# Patient Record
Sex: Female | Born: 1995 | Race: Black or African American | Hispanic: No | Marital: Single | Smoking: Never smoker
Health system: Southern US, Community
[De-identification: ages and names within clinical notes are randomized; demographics above are authoritative.]

---

## 2019-01-12 ENCOUNTER — Encounter: Payer: Self-pay | Admitting: *Deleted

## 2019-01-12 ENCOUNTER — Emergency Department
Admission: EM | Admit: 2019-01-12 | Discharge: 2019-01-12 | Disposition: A | Discharge status: Home / Self Care | Payer: Self-pay | Attending: Emergency Medicine | Admitting: Emergency Medicine

## 2019-01-12 ENCOUNTER — Emergency Department: Payer: Self-pay

## 2019-01-12 ENCOUNTER — Other Ambulatory Visit: Payer: Self-pay

## 2019-01-12 DIAGNOSIS — Y998 Other external cause status: Secondary | ICD-10-CM | POA: Insufficient documentation

## 2019-01-12 DIAGNOSIS — W450XXA Nail entering through skin, initial encounter: Secondary | ICD-10-CM | POA: Insufficient documentation

## 2019-01-12 DIAGNOSIS — S91132A Puncture wound without foreign body of left great toe without damage to nail, initial encounter: Secondary | ICD-10-CM | POA: Insufficient documentation

## 2019-01-12 DIAGNOSIS — Y9301 Activity, walking, marching and hiking: Secondary | ICD-10-CM | POA: Insufficient documentation

## 2019-01-12 DIAGNOSIS — Y929 Unspecified place or not applicable: Secondary | ICD-10-CM | POA: Insufficient documentation

## 2019-01-12 DIAGNOSIS — T148XXA Other injury of unspecified body region, initial encounter: Secondary | ICD-10-CM

## 2019-01-12 DIAGNOSIS — Z23 Encounter for immunization: Secondary | ICD-10-CM | POA: Insufficient documentation

## 2019-01-12 MED ORDER — AMOXICILLIN-POT CLAVULANATE 875-125 MG PO TABS: 1.0000 | ORAL_TABLET | Freq: Two times a day (BID) | ORAL | 0 refills | Status: AC

## 2019-01-12 MED ORDER — TETANUS-DIPHTH-ACELL PERTUSSIS 5-2.5-18.5 LF-MCG/0.5 IM SUSP
0.5000 mL | Freq: Once | INTRAMUSCULAR | Status: AC
  Administered 2019-01-12: 0.5 mL via INTRAMUSCULAR
  Filled 2019-01-12: qty 0.5

## 2019-01-12 NOTE — Discharge Instructions (Signed)
Take Augmentin twice daily for the next 10 days. °

## 2019-01-12 NOTE — ED Triage Notes (Signed)
Pt reports she stepped on a nail on her left foot yesterday and is now having pain. Worse when walking.

## 2019-01-12 NOTE — ED Notes (Signed)
See triage note  States she stepped on a nail yesterday  Having pain to right great toe area  Small puncture area noted

## 2019-01-12 NOTE — ED Notes (Signed)
Discharge instructions reviewed with ASL interpreter. Pt calling aunt for ride home at this time.

## 2019-01-12 NOTE — ED Provider Notes (Signed)
[facility] Emergency Department Provider Note  ____________________________________________  Time seen: Approximately 7:05 PM  I have reviewed the triage vital signs and the nursing notes.   HISTORY  Chief Complaint Toe Pain    HPI Casey Farrell is a [age] y.o. female presents to the emergency department with right great toe pain after patient stepped on a nail yesterday while barefoot.  Patient states that she has experienced pain and discomfort since.  She does not recall her last tetanus shot.  No alleviating measures have been attempted.        History reviewed. No pertinent past medical history.  There are no active problems to display for this patient.   History reviewed. No pertinent surgical history.  Prior to Admission medications   Medication Sig Start Date End Date Taking? Authorizing Provider  amoxicillin-clavulanate (AUGMENTIN) 875-125 MG tablet Take 1 tablet by mouth 2 (two) times daily for 10 days. 01/12/19 01/22/19  Orvil FeilWoods, Jaclyn M, PA-C    Allergies Patient has no allergy information on record.  History reviewed. No pertinent family history.  Social History Social History   Tobacco Use  . Smoking status: Never Smoker  . Smokeless tobacco: Never Used  Substance Use Topics  . Alcohol use: Never    Frequency: Never  . Drug use: Never     Review of Systems  Constitutional: No fever/chills Eyes: No visual changes. No discharge ENT: No upper respiratory complaints. Cardiovascular: no chest pain. Respiratory: no cough. No SOB. Gastrointestinal: No abdominal pain.  No nausea, no vomiting.  No diarrhea.  No constipation. Musculoskeletal: Patient has right great toe pain.  Skin: Negative for rash, abrasions, lacerations, ecchymosis. Neurological: Negative for headaches, focal weakness or numbness.   ____________________________________________   PHYSICAL EXAM:  VITAL SIGNS: ED Triage Vitals  Enc Vitals Group     BP  01/12/19 1800 128/72     Pulse Rate 01/12/19 1800 70     Resp 01/12/19 1800 18     Temp 01/12/19 1800 98 F (36.7 C)     Temp Source 01/12/19 1800 Oral     SpO2 01/12/19 1800 99 %     Weight --      Height 01/12/19 1737 5\' 2"  (1.575 m)     Head Circumference --      Peak Flow --      Pain Score 01/12/19 1736 6     Pain Loc --      Pain Edu? --      Excl. in GC? --      Constitutional: Alert and oriented. Well appearing and in no acute distress. Eyes: Conjunctivae are normal. PERRL. EOMI. Head: Atraumatic. Cardiovascular: Normal rate, regular rhythm. Normal S1 and S2.  Good peripheral circulation. Respiratory: Normal respiratory effort without tachypnea or retractions. Lungs CTAB. Good air entry to the bases with no decreased or absent breath sounds. Musculoskeletal: Full range of motion to all extremities. No gross deformities appreciated. Neurologic:  Normal speech and language. No gross focal neurologic deficits are appreciated.  Skin: Patient has puncture wound along the plantar aspect of her right foot with scab formation. Psychiatric: Mood and affect are normal. Speech and behavior are normal. Patient exhibits appropriate insight and judgement.   ____________________________________________   LABS (all labs ordered are listed, but only abnormal results are displayed)  Labs Reviewed - No data to display ____________________________________________  EKG   ____________________________________________  RADIOLOGY I personally viewed and evaluated these images as part of my medical decision making,  as well as reviewing the written report by the radiologist.  Dg Foot Complete Right  Result Date: 01/12/2019 CLINICAL DATA:  Pain after stepping on nail EXAM: RIGHT FOOT COMPLETE - 3+ VIEW COMPARISON:  None. FINDINGS: Frontal, oblique, and lateral views obtained. No fracture or dislocation. Joint spaces appear normal. No erosive change. No radiopaque foreign body or soft tissue  air. IMPRESSION: No fracture or dislocation. No evident arthropathy. No radiopaque foreign body. Electronically Signed   By: Bretta Bang III M.D.   On: 01/12/2019 18:35    ____________________________________________    PROCEDURES  Procedure(s) performed:    Procedures    Medications  Tdap (BOOSTRIX) injection 0.5 mL (has no administration in time range)     ____________________________________________   INITIAL IMPRESSION / ASSESSMENT AND PLAN / ED COURSE  Pertinent labs & imaging results that were available during my care of the patient were reviewed by me and considered in my medical decision making (see chart for details).  Review of the [place] CSRS was performed in accordance of the [facility] prior to dispensing any controlled drugs.           Assessment and plan Puncture wound 23 year old female presents to the emergency department with right great toe pain after stepping on a metal nail.  X-ray examination of the right great toe reveals no bony abnormality or retained foreign body.  Patient's tetanus status was updated in the emergency department and she was discharged with Augmentin.  Use of an American sign language translator was used during this emergency department encounter.  All patient questions were answered.    ____________________________________________  FINAL CLINICAL IMPRESSION(S) / ED DIAGNOSES  Final diagnoses:  Puncture wound      NEW MEDICATIONS STARTED DURING THIS VISIT:  ED Discharge Orders         Ordered    amoxicillin-clavulanate (AUGMENTIN) 875-125 MG tablet  2 times daily     01/12/19 1903              This chart was dictated using voice recognition software/Dragon. Despite best efforts to proofread, errors can occur which can change the meaning. Any change was purely unintentional.    Orvil Feil, PA-C 01/12/19 Elige Ko, MD 01/12/19 2045

## 2019-01-12 NOTE — ED Notes (Signed)
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 Pt uses ASL and is able to read lips.

## 2019-01-25 LAB — HM PAP SMEAR

## 2019-02-10 ENCOUNTER — Telehealth: Payer: Self-pay

## 2019-02-13 NOTE — Telephone Encounter (Signed)
TC with mother and patient.  Pt is hearing impaired. Informed of ASCUS pap and need for repeat pap in 1 year. Aileen Fass, RN

## 2020-02-28 ENCOUNTER — Telehealth: Payer: Self-pay

## 2020-02-28 NOTE — Telephone Encounter (Signed)
Telephone call to patient today regarding the need for a repeat PAP and physical due in June.  Call dropped when Step Mom's cell was called.  Telephone call to other contact (Aunt Jenene Slicker).  She will have patient return my call today.  Casey Farrell was available to assist in scheduling an appointment in Epic. Hart Carwin, RN

## 2020-03-06 NOTE — Telephone Encounter (Signed)
Telephone call to patient today regarding the need for a repeat PAP.  Call dropped when Sep-Mother's number dialed.  Telephone call to [facility], she is upset we keep calling her even though she is listed in Epic as an emergency contact for patient.  She will give patient a message to return my call.  PAP letter mailed today. Hart Carwin, RN

## 2021-04-17 IMAGING — DX RIGHT FOOT COMPLETE - 3+ VIEW
3 series · 3 of 3 positions shown · non-contrast
Comparison: None.

CLINICAL DATA: Pain after stepping on nail

EXAM:
RIGHT FOOT COMPLETE - 3+ VIEW

[foot ap]
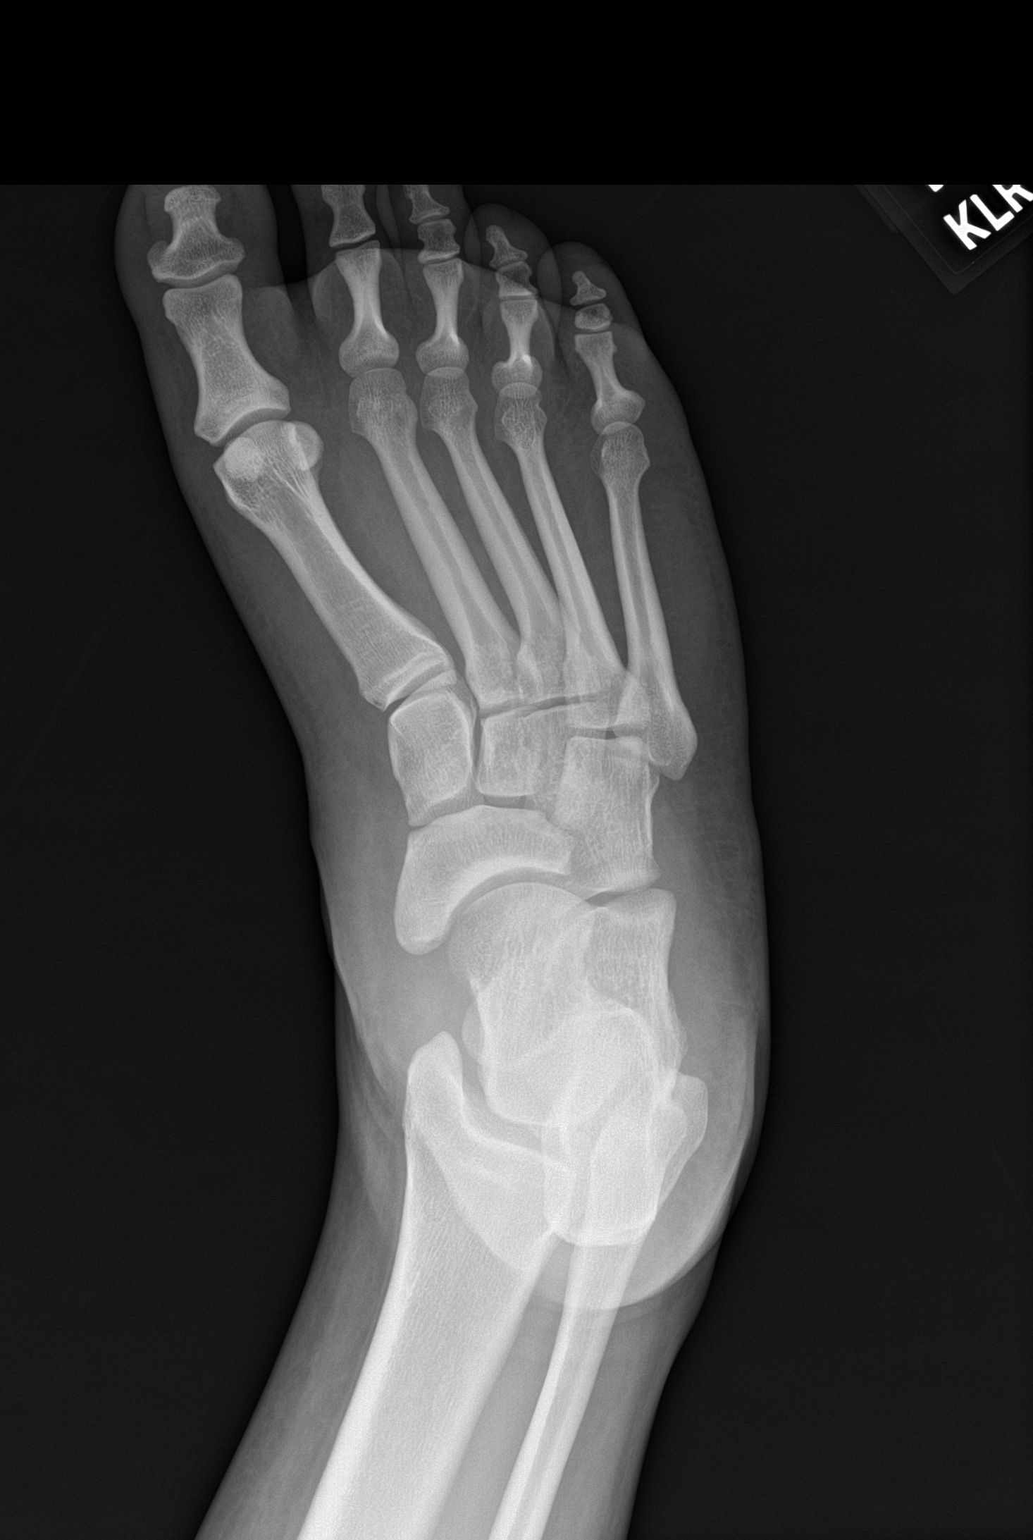

[foot obl]
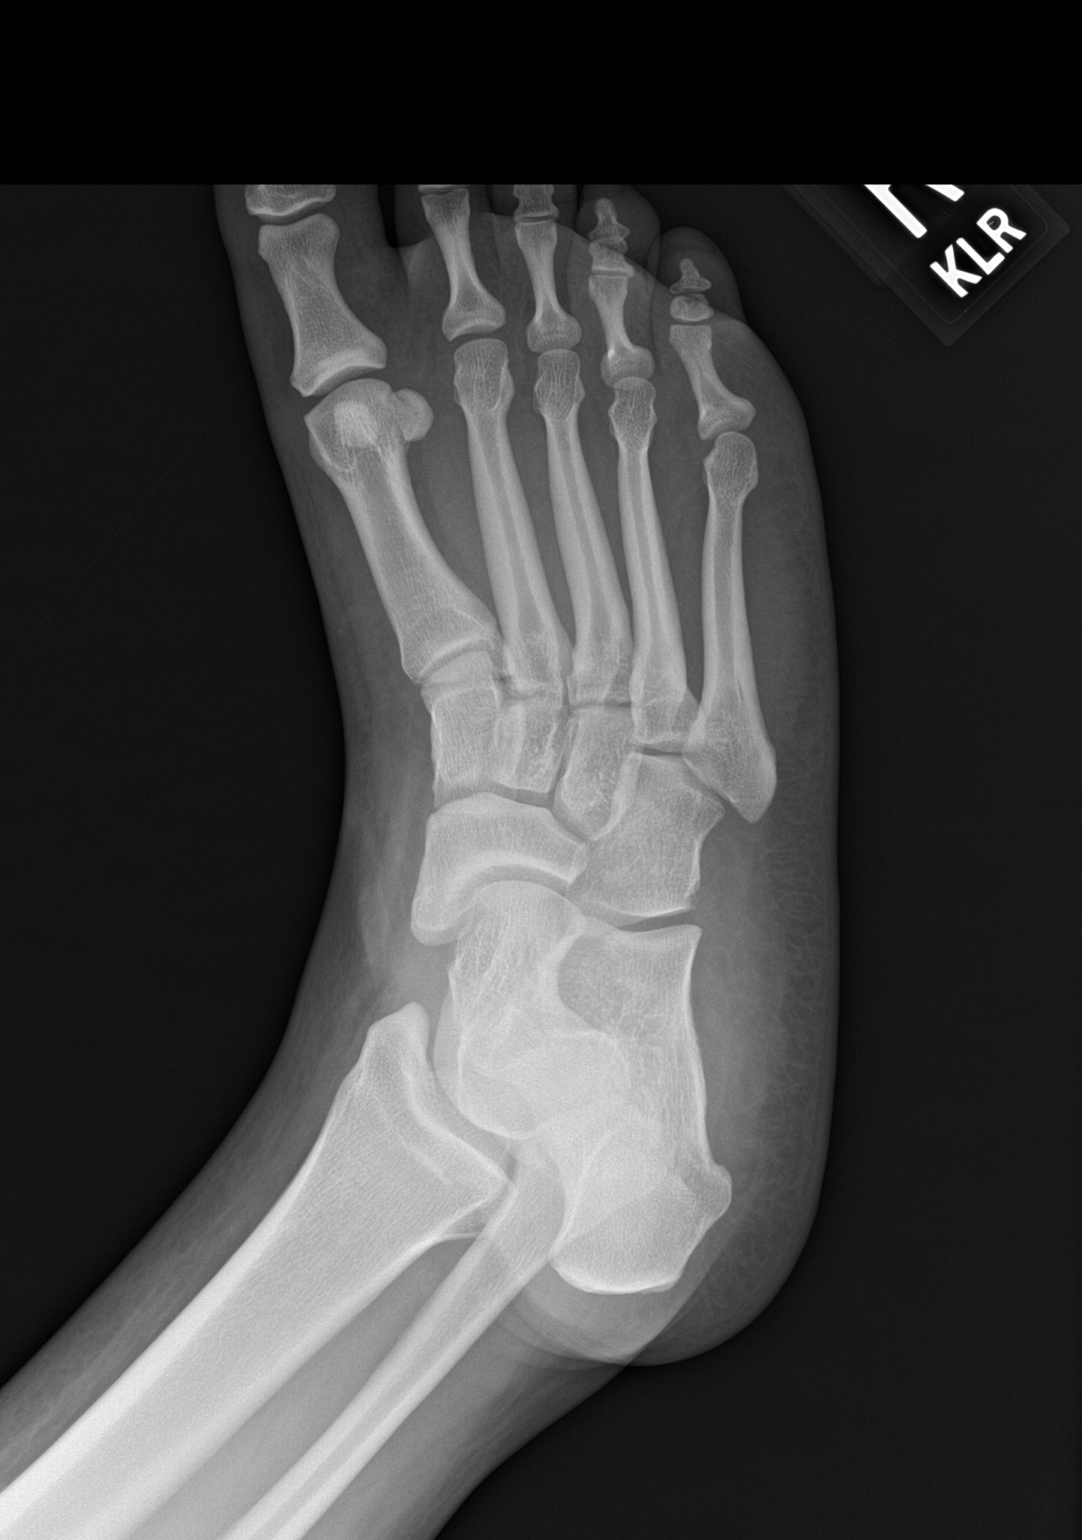

[foot lat]
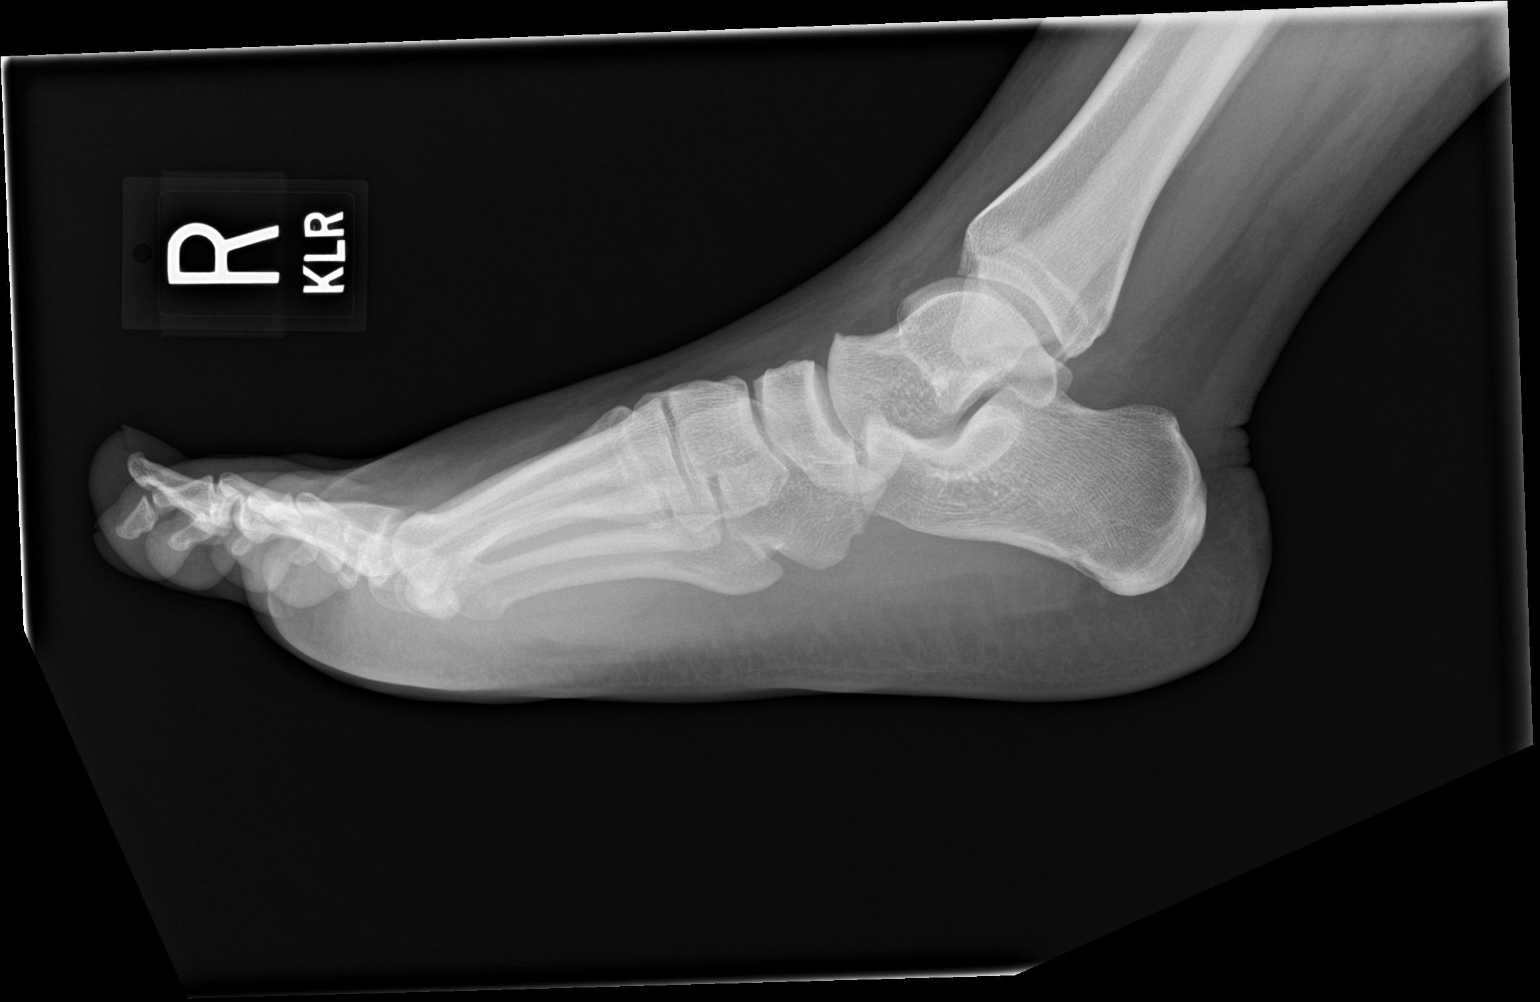

[3 of 3 positions shown; findings below may reference images not displayed]

FINDINGS: Frontal, oblique, and lateral views obtained. No fracture or
dislocation. Joint spaces appear normal. No erosive change. No
radiopaque foreign body or soft tissue air.
IMPRESSION: No fracture or dislocation. No evident arthropathy. No radiopaque
foreign body.
